# Patient Record
Sex: Female | Born: 1945 | Hispanic: No | Marital: Single | Smoking: Never smoker
Health system: Southern US, Community
[De-identification: ages and names within clinical notes are randomized; demographics above are authoritative.]

## PROBLEM LIST (undated history)

## (undated) DIAGNOSIS — I10 Essential (primary) hypertension: Secondary | ICD-10-CM

---

## 1972-09-03 HISTORY — PX: BREAST EXCISIONAL BIOPSY: SUR124

## 1978-09-03 HISTORY — PX: BREAST EXCISIONAL BIOPSY: SUR124

## 1999-06-30 ENCOUNTER — Other Ambulatory Visit: Admission: RE | Admit: 1999-06-30 | Discharge: 1999-06-30 | Payer: Self-pay | Admitting: *Deleted

## 2000-07-09 ENCOUNTER — Other Ambulatory Visit: Admission: RE | Admit: 2000-07-09 | Discharge: 2000-07-09 | Payer: Self-pay | Admitting: *Deleted

## 2003-06-10 ENCOUNTER — Other Ambulatory Visit: Admission: RE | Admit: 2003-06-10 | Discharge: 2003-06-10 | Payer: Self-pay | Admitting: *Deleted

## 2003-06-28 ENCOUNTER — Ambulatory Visit (HOSPITAL_COMMUNITY): Admission: RE | Admit: 2003-06-28 | Discharge: 2003-06-28 | Payer: Self-pay | Admitting: Internal Medicine

## 2013-04-17 ENCOUNTER — Other Ambulatory Visit (HOSPITAL_COMMUNITY): Payer: Self-pay | Admitting: Family Medicine

## 2013-04-17 DIAGNOSIS — Z09 Encounter for follow-up examination after completed treatment for conditions other than malignant neoplasm: Secondary | ICD-10-CM

## 2013-04-17 DIAGNOSIS — Z139 Encounter for screening, unspecified: Secondary | ICD-10-CM

## 2013-04-24 ENCOUNTER — Ambulatory Visit (HOSPITAL_COMMUNITY): Payer: Self-pay

## 2015-02-03 DIAGNOSIS — E663 Overweight: Secondary | ICD-10-CM

## 2015-02-03 DIAGNOSIS — I10 Essential (primary) hypertension: Secondary | ICD-10-CM | POA: Insufficient documentation

## 2015-02-03 DIAGNOSIS — Z6829 Body mass index (BMI) 29.0-29.9, adult: Secondary | ICD-10-CM

## 2015-02-04 ENCOUNTER — Ambulatory Visit (INDEPENDENT_AMBULATORY_CARE_PROVIDER_SITE_OTHER): Payer: BLUE CROSS/BLUE SHIELD | Admitting: Cardiology

## 2015-02-04 ENCOUNTER — Encounter: Payer: Self-pay | Admitting: Cardiology

## 2015-02-04 VITALS — BP 160/100 | HR 80 | Ht 67.0 in | Wt 192.0 lb

## 2015-02-04 DIAGNOSIS — R011 Cardiac murmur, unspecified: Secondary | ICD-10-CM | POA: Diagnosis not present

## 2015-02-04 DIAGNOSIS — I10 Essential (primary) hypertension: Secondary | ICD-10-CM

## 2015-02-04 MED ORDER — CHLORTHALIDONE 25 MG PO TABS: 25.0000 mg | ORAL_TABLET | Freq: Every day | ORAL | Status: DC

## 2015-02-04 NOTE — Progress Notes (Signed)
Clinical Summary Gwendolyn Kemp is a 69 y.o.female seen today as a new patient for the following medical problems.  1. HTN - reports history of 20+ years of HTN - previously seen by Dr Melvern Banker in 2002. She states she had an [place] of kidneys and heart at that time.  - checks at home using wrist monitor. Typically 130-140s/80s-90s. Her numbers at clinic visits are usually much more elevated.  - compliant with meds. Recently given some bystolic samples by pcp but hast not started yet  - no tobacco. No EtoH. Never been tested for sleep apnea (unsure if snores, unsure of apneic episodes, no daytime somnolence). Rarely takes NSAIDs. She reports prior renal US by Dr Melvern Banker, unclear if renal arteries or not - has cut back on salt in diet   PMH HTN     Current Outpatient Prescriptions  Medication Sig Dispense Refill  . aspirin 81 MG tablet Take 81 mg by mouth daily.    . clonazePAM (KLONOPIN) 0.5 MG tablet Take 0.5 mg by mouth 2 (two) times daily as needed for anxiety.    . fluticasone (FLONASE) 50 MCG/ACT nasal spray Place 1 spray into both nostrils daily.    . naproxen (NAPROSYN) 500 MG tablet Take 500 mg by mouth 2 (two) times daily with a meal.    . simvastatin (ZOCOR) 10 MG tablet Take 10 mg by mouth daily.     No current facility-administered medications for this visit.     No past surgical history on file.   NKDA    No family history on file.   Social History History   Social History  . Marital Status: Single    Spouse Name: N/A  . Number of Children: N/A  . Years of Education: N/A   Occupational History  . Not on file.   Social History Main Topics  . Smoking status: Never Smoker   . Smokeless tobacco: Not on file  . Alcohol Use: Not on file  . Drug Use: Not on file  . Sexual Activity: Not on file   Other Topics Concern  . Not on file   Social History Narrative  . No narrative on file     Review of Systems CONSTITUTIONAL: No weight loss, fever,  chills, weakness or fatigue.  HEENT: Eyes: No visual loss, blurred vision, double vision or yellow sclerae.No hearing loss, sneezing, congestion, runny nose or sore throat.  SKIN: No rash or itching.  CARDIOVASCULAR:  RESPIRATORY: No shortness of breath, cough or sputum.  GASTROINTESTINAL: No anorexia, nausea, vomiting or diarrhea. No abdominal pain or blood.  GENITOURINARY: No burning on urination, no polyuria NEUROLOGICAL: No headache, dizziness, syncope, paralysis, ataxia, numbness or tingling in the extremities. No change in bowel or bladder control.  MUSCULOSKELETAL: No muscle, back pain, joint pain or stiffness.  LYMPHATICS: No enlarged nodes. No history of splenectomy.  PSYCHIATRIC: No history of depression or anxiety.  ENDOCRINOLOGIC: No reports of sweating, cold or heat intolerance. No polyuria or polydipsia.  Marland Kitchen   Physical Examination Filed Vitals:   02/04/15 1043  BP: 160/100  Pulse: 80   Filed Vitals:   02/04/15 1043  Height: 5\' 7"  (1.702 m)  Weight: 192 lb (87.091 kg)    Gen: resting comfortably, no acute distress HEENT: no scleral icterus, pupils equal round and reactive, no palptable cervical adenopathy,  CV: RRR, 3/6 systolic murmur RUSB, no jVD Resp: Clear to auscultation bilaterally GI: abdomen is soft, non-tender, non-distended, normal bowel sounds, no hepatosplenomegaly MSK:  extremities are warm, no edema.  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 She will keep bp log x 2 weeks and also bring her bp cuff next visit - will stop HCTZ and start chlorthalidone for better bp control - request pcp labs, request Dr Gambles old records. Will see if needs additional evaluation for  possible secondary HTN - given info on DASH diet  2. Heart murmur - obtain echo      Gwendolyn Kemp, M.D.

## 2015-02-04 NOTE — Patient Instructions (Signed)
Your physician recommends that you schedule a follow-up appointment in:  1 month  Your physician has requested that you have an echocardiogram. Echocardiography is a painless test that uses sound waves to create images of your heart. It provides your doctor with information about the size and shape of your heart and how well your heart's chambers and valves are working. This procedure takes approximately one hour. There are no restrictions for this procedure.  STOP HCTZ   START CHLORTHALIDONE 25 MG  DAILY  KEEP BLOOD PRESSURE LOG FOR 2 WEEKS & DROP OFF DASH Eating Plan DASH stands for "Dietary Approaches to Stop Hypertension." The DASH eating plan is a healthy eating plan that has been shown to reduce high blood pressure (hypertension). Additional health benefits may include reducing the risk of type 2 diabetes mellitus, heart disease, and stroke. The DASH eating plan may also help with weight loss. WHAT DO I NEED TO KNOW ABOUT THE DASH EATING PLAN? For the DASH eating plan, you will follow these general guidelines:  Choose foods with a percent daily value for sodium of less than 5% (as listed on the food label).  Use salt-free seasonings or herbs instead of table salt or sea salt.  Check with your health care provider or pharmacist before using salt substitutes.  Eat lower-sodium products, often labeled as "lower sodium" or "no salt added."  Eat fresh foods.  Eat more vegetables, fruits, and low-fat dairy products.  Choose whole grains. Look for the word "whole" as the first word in the ingredient list.  Choose fish and skinless chicken or [place] more often than red meat. Limit fish, poultry, and meat to 6 oz (170 g) each day.  Limit sweets, desserts, sugars, and sugary drinks.  Choose heart-healthy fats.  Limit cheese to 1 oz (28 g) per day.  Eat more home-cooked food and less restaurant, buffet, and fast food.  Limit fried foods.  Cook foods using methods other than  frying.  Limit canned vegetables. If you do use them, rinse them well to decrease the sodium.  When eating at a restaurant, ask that your food be prepared with less salt, or no salt if possible. WHAT FOODS CAN I EAT? Seek help from a dietitian for individual calorie needs. Grains Whole grain or whole wheat bread. [place] rice. Whole grain or whole wheat pasta. Quinoa, bulgur, and whole grain cereals. Low-sodium cereals. Corn or whole wheat flour tortillas. Whole grain cornbread. Whole grain crackers. Low-sodium crackers. Vegetables Fresh or frozen vegetables (raw, steamed, roasted, or grilled). Low-sodium or reduced-sodium tomato and vegetable juices. Low-sodium or reduced-sodium tomato sauce and paste. Low-sodium or reduced-sodium canned vegetables.  Fruits All fresh, canned (in natural juice), or frozen fruits. Meat and Other Protein Products Ground beef (85% or leaner), grass-fed beef, or beef trimmed of fat. Skinless chicken or [place]. Ground chicken or [place]. Pork trimmed of fat. All fish and seafood. Eggs. Dried beans, peas, or lentils. Unsalted nuts and seeds. Unsalted canned beans. Dairy Low-fat dairy products, such as skim or 1% milk, 2% or reduced-fat cheeses, low-fat ricotta or cottage cheese, or plain low-fat yogurt. Low-sodium or reduced-sodium cheeses. Fats and Oils Tub margarines without trans fats. Light or reduced-fat mayonnaise and salad dressings (reduced sodium). Avocado. Safflower, olive, or canola oils. Natural peanut or almond butter. Other Unsalted popcorn and pretzels. The items listed above may not be a complete list of recommended foods or beverages. Contact your dietitian for more options. WHAT FOODS ARE NOT RECOMMENDED? Grains White bread. White  pasta. White rice. Refined cornbread. Bagels and croissants. Crackers that contain trans fat. Vegetables Creamed or fried vegetables. Vegetables in a cheese sauce. Regular canned vegetables. Regular canned tomato sauce  and paste. Regular tomato and vegetable juices. Fruits Dried fruits. Canned fruit in light or heavy syrup. Fruit juice. Meat and Other Protein Products Fatty cuts of meat. Ribs, chicken wings, bacon, sausage, bologna, salami, chitterlings, fatback, hot dogs, bratwurst, and packaged luncheon meats. Salted nuts and seeds. Canned beans with salt. Dairy Whole or 2% milk, cream, half-and-half, and cream cheese. Whole-fat or sweetened yogurt. Full-fat cheeses or blue cheese. Nondairy creamers and whipped toppings. Processed cheese, cheese spreads, or cheese curds. Condiments Onion and garlic salt, seasoned salt, table salt, and sea salt. Canned and packaged gravies. Worcestershire sauce. Tartar sauce. Barbecue sauce. Teriyaki sauce. Soy sauce, including reduced sodium. Steak sauce. Fish sauce. Oyster sauce. Cocktail sauce. Horseradish. Ketchup and mustard. Meat flavorings and tenderizers. Bouillon cubes. Hot sauce. Tabasco sauce. Marinades. Taco seasonings. Relishes. Fats and Oils Butter, stick margarine, lard, shortening, ghee, and bacon fat. Coconut, palm kernel, or palm oils. Regular salad dressings. Other Pickles and olives. Salted popcorn and pretzels. The items listed above may not be a complete list of foods and beverages to avoid. Contact your dietitian for more information. WHERE CAN I FIND MORE INFORMATION? National Heart, Lung, and Blood Institute: travelstabloid.com Document Released: 08/09/2011 Document Revised: 01/04/2014 Document Reviewed: 06/24/2013 [facility] Patient Information 2015 [place], [place]. This information is not intended to replace advice given to you by your health care provider. Make sure you discuss any questions you have with your health care provider.  Thanks for choosing [facility]!!!

## 2015-02-06 ENCOUNTER — Encounter: Payer: Self-pay | Admitting: Cardiology

## 2015-02-25 ENCOUNTER — Other Ambulatory Visit (HOSPITAL_COMMUNITY): Payer: BLUE CROSS/BLUE SHIELD

## 2015-03-04 ENCOUNTER — Ambulatory Visit: Payer: BLUE CROSS/BLUE SHIELD | Admitting: Cardiology

## 2015-11-03 DIAGNOSIS — H6121 Impacted cerumen, right ear: Secondary | ICD-10-CM | POA: Diagnosis not present

## 2015-11-03 DIAGNOSIS — E782 Mixed hyperlipidemia: Secondary | ICD-10-CM | POA: Diagnosis not present

## 2015-11-03 DIAGNOSIS — Z1389 Encounter for screening for other disorder: Secondary | ICD-10-CM | POA: Diagnosis not present

## 2015-11-03 DIAGNOSIS — Z6831 Body mass index (BMI) 31.0-31.9, adult: Secondary | ICD-10-CM | POA: Diagnosis not present

## 2015-11-03 DIAGNOSIS — E6609 Other obesity due to excess calories: Secondary | ICD-10-CM | POA: Diagnosis not present

## 2015-11-03 DIAGNOSIS — I10 Essential (primary) hypertension: Secondary | ICD-10-CM | POA: Diagnosis not present

## 2016-02-09 DIAGNOSIS — Z6831 Body mass index (BMI) 31.0-31.9, adult: Secondary | ICD-10-CM | POA: Diagnosis not present

## 2016-02-09 DIAGNOSIS — Z1389 Encounter for screening for other disorder: Secondary | ICD-10-CM | POA: Diagnosis not present

## 2016-02-09 DIAGNOSIS — I10 Essential (primary) hypertension: Secondary | ICD-10-CM | POA: Diagnosis not present

## 2016-02-09 DIAGNOSIS — E782 Mixed hyperlipidemia: Secondary | ICD-10-CM | POA: Diagnosis not present

## 2016-02-09 DIAGNOSIS — R0989 Other specified symptoms and signs involving the circulatory and respiratory systems: Secondary | ICD-10-CM | POA: Diagnosis not present

## 2016-02-28 ENCOUNTER — Other Ambulatory Visit (HOSPITAL_COMMUNITY): Payer: Self-pay | Admitting: Family Medicine

## 2016-02-28 DIAGNOSIS — Z1231 Encounter for screening mammogram for malignant neoplasm of breast: Secondary | ICD-10-CM

## 2016-05-04 ENCOUNTER — Other Ambulatory Visit: Payer: Self-pay | Admitting: Family Medicine

## 2016-05-04 DIAGNOSIS — Z1231 Encounter for screening mammogram for malignant neoplasm of breast: Secondary | ICD-10-CM

## 2016-05-28 ENCOUNTER — Ambulatory Visit
Admission: RE | Admit: 2016-05-28 | Discharge: 2016-05-28 | Disposition: A | Discharge status: Home / Self Care | Payer: PPO | Source: Ambulatory Visit | Attending: Family Medicine | Admitting: Family Medicine

## 2016-05-28 DIAGNOSIS — Z1231 Encounter for screening mammogram for malignant neoplasm of breast: Secondary | ICD-10-CM | POA: Diagnosis not present

## 2016-08-13 DIAGNOSIS — H524 Presbyopia: Secondary | ICD-10-CM | POA: Diagnosis not present

## 2017-01-03 DIAGNOSIS — I10 Essential (primary) hypertension: Secondary | ICD-10-CM | POA: Diagnosis not present

## 2017-01-03 DIAGNOSIS — Z1389 Encounter for screening for other disorder: Secondary | ICD-10-CM | POA: Diagnosis not present

## 2017-01-03 DIAGNOSIS — E782 Mixed hyperlipidemia: Secondary | ICD-10-CM | POA: Diagnosis not present

## 2017-01-03 DIAGNOSIS — Z6830 Body mass index (BMI) 30.0-30.9, adult: Secondary | ICD-10-CM | POA: Diagnosis not present

## 2017-01-03 DIAGNOSIS — Z1211 Encounter for screening for malignant neoplasm of colon: Secondary | ICD-10-CM | POA: Diagnosis not present

## 2017-01-22 DIAGNOSIS — Z1211 Encounter for screening for malignant neoplasm of colon: Secondary | ICD-10-CM | POA: Diagnosis not present

## 2017-02-13 DIAGNOSIS — I10 Essential (primary) hypertension: Secondary | ICD-10-CM | POA: Diagnosis not present

## 2017-02-13 DIAGNOSIS — E782 Mixed hyperlipidemia: Secondary | ICD-10-CM | POA: Diagnosis not present

## 2017-02-13 DIAGNOSIS — Z1389 Encounter for screening for other disorder: Secondary | ICD-10-CM | POA: Diagnosis not present

## 2017-02-13 DIAGNOSIS — Z634 Disappearance and death of family member: Secondary | ICD-10-CM | POA: Diagnosis not present

## 2017-06-17 ENCOUNTER — Other Ambulatory Visit: Payer: Self-pay | Admitting: Family Medicine

## 2017-06-17 DIAGNOSIS — Z1231 Encounter for screening mammogram for malignant neoplasm of breast: Secondary | ICD-10-CM

## 2017-07-03 ENCOUNTER — Ambulatory Visit
Admission: RE | Admit: 2017-07-03 | Discharge: 2017-07-03 | Disposition: A | Discharge status: Home / Self Care | Payer: PPO | Source: Ambulatory Visit | Attending: Family Medicine | Admitting: Family Medicine

## 2017-07-03 DIAGNOSIS — Z1231 Encounter for screening mammogram for malignant neoplasm of breast: Secondary | ICD-10-CM | POA: Diagnosis not present

## 2017-07-05 ENCOUNTER — Other Ambulatory Visit: Payer: Self-pay | Admitting: Family Medicine

## 2017-07-05 DIAGNOSIS — R928 Other abnormal and inconclusive findings on diagnostic imaging of breast: Secondary | ICD-10-CM

## 2017-07-10 ENCOUNTER — Ambulatory Visit
Admission: RE | Admit: 2017-07-10 | Discharge: 2017-07-10 | Disposition: A | Discharge status: Home / Self Care | Payer: PPO | Source: Ambulatory Visit | Attending: Family Medicine | Admitting: Family Medicine

## 2017-07-10 DIAGNOSIS — N651 Disproportion of reconstructed breast: Secondary | ICD-10-CM | POA: Diagnosis not present

## 2017-07-10 DIAGNOSIS — R928 Other abnormal and inconclusive findings on diagnostic imaging of breast: Secondary | ICD-10-CM

## 2017-07-10 DIAGNOSIS — R922 Inconclusive mammogram: Secondary | ICD-10-CM | POA: Diagnosis not present

## 2017-07-11 ENCOUNTER — Other Ambulatory Visit: Payer: PPO

## 2017-12-26 DIAGNOSIS — Z1389 Encounter for screening for other disorder: Secondary | ICD-10-CM | POA: Diagnosis not present

## 2017-12-26 DIAGNOSIS — Z6831 Body mass index (BMI) 31.0-31.9, adult: Secondary | ICD-10-CM | POA: Diagnosis not present

## 2017-12-26 DIAGNOSIS — I10 Essential (primary) hypertension: Secondary | ICD-10-CM | POA: Diagnosis not present

## 2017-12-26 DIAGNOSIS — E6609 Other obesity due to excess calories: Secondary | ICD-10-CM | POA: Diagnosis not present

## 2018-01-02 DIAGNOSIS — E6609 Other obesity due to excess calories: Secondary | ICD-10-CM | POA: Diagnosis not present

## 2018-01-02 DIAGNOSIS — Z6831 Body mass index (BMI) 31.0-31.9, adult: Secondary | ICD-10-CM | POA: Diagnosis not present

## 2018-01-02 DIAGNOSIS — Z1389 Encounter for screening for other disorder: Secondary | ICD-10-CM | POA: Diagnosis not present

## 2018-01-02 DIAGNOSIS — E7849 Other hyperlipidemia: Secondary | ICD-10-CM | POA: Diagnosis not present

## 2018-05-15 DIAGNOSIS — E6609 Other obesity due to excess calories: Secondary | ICD-10-CM | POA: Diagnosis not present

## 2018-05-15 DIAGNOSIS — I10 Essential (primary) hypertension: Secondary | ICD-10-CM | POA: Diagnosis not present

## 2018-05-15 DIAGNOSIS — Z23 Encounter for immunization: Secondary | ICD-10-CM | POA: Diagnosis not present

## 2018-05-15 DIAGNOSIS — Z6830 Body mass index (BMI) 30.0-30.9, adult: Secondary | ICD-10-CM | POA: Diagnosis not present

## 2018-05-15 DIAGNOSIS — Z1389 Encounter for screening for other disorder: Secondary | ICD-10-CM | POA: Diagnosis not present

## 2018-07-21 DIAGNOSIS — Z1211 Encounter for screening for malignant neoplasm of colon: Secondary | ICD-10-CM | POA: Diagnosis not present

## 2018-08-07 DIAGNOSIS — I10 Essential (primary) hypertension: Secondary | ICD-10-CM | POA: Diagnosis not present

## 2018-08-07 DIAGNOSIS — Z6830 Body mass index (BMI) 30.0-30.9, adult: Secondary | ICD-10-CM | POA: Diagnosis not present

## 2018-08-07 DIAGNOSIS — Z1389 Encounter for screening for other disorder: Secondary | ICD-10-CM | POA: Diagnosis not present

## 2018-08-07 DIAGNOSIS — Z0001 Encounter for general adult medical examination with abnormal findings: Secondary | ICD-10-CM | POA: Diagnosis not present

## 2018-08-07 DIAGNOSIS — E7849 Other hyperlipidemia: Secondary | ICD-10-CM | POA: Diagnosis not present

## 2018-08-07 DIAGNOSIS — E6609 Other obesity due to excess calories: Secondary | ICD-10-CM | POA: Diagnosis not present

## 2018-08-20 DIAGNOSIS — Z6830 Body mass index (BMI) 30.0-30.9, adult: Secondary | ICD-10-CM | POA: Diagnosis not present

## 2018-08-20 DIAGNOSIS — E6609 Other obesity due to excess calories: Secondary | ICD-10-CM | POA: Diagnosis not present

## 2018-08-20 DIAGNOSIS — E7849 Other hyperlipidemia: Secondary | ICD-10-CM | POA: Diagnosis not present

## 2018-08-20 DIAGNOSIS — Z1389 Encounter for screening for other disorder: Secondary | ICD-10-CM | POA: Diagnosis not present

## 2018-09-15 ENCOUNTER — Ambulatory Visit: Payer: PPO

## 2018-09-30 ENCOUNTER — Ambulatory Visit (INDEPENDENT_AMBULATORY_CARE_PROVIDER_SITE_OTHER): Payer: PPO

## 2018-09-30 DIAGNOSIS — Z1211 Encounter for screening for malignant neoplasm of colon: Secondary | ICD-10-CM

## 2018-09-30 MED ORDER — NA SULFATE-K SULFATE-MG SULF 17.5-3.13-1.6 GM/177ML PO SOLN: 1.0000 | ORAL | 0 refills | Status: AC

## 2018-09-30 NOTE — Patient Instructions (Addendum)
Gwendolyn Kemp   Aug 16, 1946 MRN: [id number]    Procedure Date: 05/19/2019 Time to register: 7:30am Place to register: [facility] Procedure Time: 8:30am Scheduled provider: R. Garfield Cornea, MD  PREPARATION FOR COLONOSCOPY WITH TRI-LYTE SPLIT PREP  Please notify us immediately if you are diabetic, take iron supplements, or if you are on Coumadin or any other blood thinners.   You will need to purchase 1 fleet enema and 1 box of Bisacodyl 10m tablets. You can find these over the counter at your pharmacy.    2 DAYS BEFORE PROCEDURE:  DATE: 05/17/2019  DAY: Sunday Begin clear liquid diet AFTER your lunch meal. NO SOLID FOODS after this point.  1 DAY BEFORE PROCEDURE:  DATE: 05/18/2019   DAY: Monday Continue clear liquids the entire day - NO SOLID FOOD.   At 2:00 pm:  Take 2 Bisacodyl tablets.   At 4:00pm:  Start drinking your solution. Make sure you mix well per instructions on the bottle. Try to drink 1 (one) 8 ounce glass every 10-15 minutes until you have consumed HALF the jug. You should complete by 6:00pm.You must keep the left over solution refrigerated until completed next day.  Continue clear liquids. You must drink plenty of clear liquids to prevent dehyration and kidney failure.     DAY OF PROCEDURE:   DATE: 05/19/2019   DAY: Tuesday If you take medications for your heart, blood pressure or breathing, you may take these medications.   Five hours before your procedure time @ 3:30am:  Finish remaining amout of bowel prep, drinking 1 (one) 8 ounce glass every 10-15 minutes until complete. You have two hours to consume remaining prep.   Three hours before your procedure time '@5' :30am:  Nothing by mouth.   At least one hour before going to the hospital:  Give yourself one Fleet enema.  You may take your morning medications with sip of water unless we have instructed otherwise.      Please see below for Dietary Information.  CLEAR LIQUIDS INCLUDE:  Water Jello  (NOT red in color)   Ice Popsicles (NOT red in color)   Tea (sugar ok, no milk/cream) Powdered fruit flavored drinks  Coffee (sugar ok, no milk/cream) Gatorade/ Lemonade/ Kool-Aid  (NOT red in color)   Juice: apple, white grape, white cranberry Soft drinks  Clear bullion, consomme, broth (fat free beef/chicken/vegetable)  Carbonated beverages (any kind)  Strained chicken noodle soup Hard Candy   Remember: Clear liquids are liquids that will allow you to see your fingers on the other side of a clear glass. Be sure liquids are NOT red in color, and not cloudy, but CLEAR.  DO NOT EAT OR DRINK ANY OF THE FOLLOWING:  Dairy products of any kind   Cranberry juice Tomato juice / V8 juice   Grapefruit juice Orange juice     Red grape juice  Do not eat any solid foods, including such foods as: cereal, oatmeal, yogurt, fruits, vegetables, creamed soups, eggs, bread, crackers, pureed foods in a blender, etc.   HELPFUL HINTS FOR DRINKING PREP SOLUTION:   Make sure prep is extremely cold. Mix and refrigerate the the morning of the prep. You may also put in the freezer.   You may try mixing some Crystal Light or Country Time Lemonade if you prefer. Mix in small amounts; add more if necessary.  Try drinking through a straw  Rinse mouth with water or a mouthwash between glasses, to remove after-taste.  Try sipping on  a cold beverage /ice/ popsicles between glasses of prep.  Place a piece of sugar-free hard candy in mouth between glasses.  If you become nauseated, try consuming smaller amounts, or stretch out the time between glasses. Stop for 30-60 minutes, then slowly start back drinking.        OTHER INSTRUCTIONS  You will need a responsible adult at least 71 years of age to accompany you and drive you home. This person must remain in the waiting room during your procedure. The hospital will cancel your procedure if you do not have a responsible adult with you.   1. Wear loose fitting  clothing that is easily removed. 2. Leave jewelry and other valuables at home.  3. Remove all body piercing jewelry and leave at home. 4. Total time from sign-in until discharge is approximately 2-3 hours. 5. You should go home directly after your procedure and rest. You can resume normal activities the day after your procedure. 6. The day of your procedure you should not:  Drive  Make legal decisions  Operate machinery  Drink alcohol  Return to work   You may call the office (Dept: [phone]) before 5:00pm, or page the doctor on call [phone]) after 5:00pm, for further instructions, if necessary.   Insurance Information YOU WILL NEED TO CHECK WITH YOUR INSURANCE COMPANY FOR THE BENEFITS OF COVERAGE YOU HAVE FOR THIS PROCEDURE.  UNFORTUNATELY, NOT ALL INSURANCE COMPANIES HAVE BENEFITS TO COVER ALL OR PART OF THESE TYPES OF PROCEDURES.  IT IS YOUR RESPONSIBILITY TO CHECK YOUR BENEFITS, HOWEVER, WE WILL BE GLAD TO ASSIST YOU WITH ANY CODES YOUR INSURANCE COMPANY MAY NEED.    PLEASE NOTE THAT MOST INSURANCE COMPANIES WILL NOT COVER A SCREENING COLONOSCOPY FOR PEOPLE UNDER THE AGE OF 50  IF YOU HAVE BCBS INSURANCE, YOU MAY HAVE BENEFITS FOR A SCREENING COLONOSCOPY BUT IF POLYPS ARE FOUND THE DIAGNOSIS WILL CHANGE AND THEN YOU MAY HAVE A DEDUCTIBLE THAT WILL NEED TO BE MET. SO PLEASE MAKE SURE YOU CHECK YOUR BENEFITS FOR A SCREENING COLONOSCOPY AS WELL AS A DIAGNOSTIC COLONOSCOPY.

## 2018-09-30 NOTE — Progress Notes (Signed)
Gastroenterology Pre-Procedure Review  Request Date:09/30/18 Requesting Physician: Delman Cheadle [place]- last tcs 2004- [facility]- no report in epic  PATIENT REVIEW QUESTIONS: The patient responded to the following health history questions as indicated:    1. Diabetes Melitis: no 2. Joint replacements in the past 12 months: no 3. Major health problems in the past 3 months: no 4. Has an artificial valve or MVP: no 5. Has a defibrillator: no 6. Has been advised in past to take antibiotics in advance of a procedure like teeth cleaning: no 7. Family history of colon cancer: no  8. Alcohol Use: no 9. History of sleep apnea: no  10. History of coronary artery or other vascular stents placed within the last 12 months: no 11. History of any prior anesthesia complications: no    MEDICATIONS & ALLERGIES:    Patient reports the following regarding taking any blood thinners:   Plavix? no Aspirin? no Coumadin? no Brilinta? no Xarelto? no Eliquis? no Pradaxa? no Savaysa? no Effient? no  Patient confirms/reports the following medications:  Current Outpatient Medications  Medication Sig Dispense Refill  . amLODipine (NORVASC) 10 MG tablet Take 10 mg by mouth daily.   0  . chlorthalidone (HYGROTON) 25 MG tablet Take 1 tablet (25 mg total) by mouth daily. 90 tablet 3  . POTASSIUM CHLORIDE PO Take by mouth.    . quinapril (ACCUPRIL) 40 MG tablet Take 40 mg by mouth daily.   8   No current facility-administered medications for this visit.     Patient confirms/reports the following allergies:  No Known Allergies  No orders of the defined types were placed in this encounter.   AUTHORIZATION INFORMATION Primary Insurance: [employer],  ID #: [id number] Pre-Cert / Josem Kaufmann required: no   SCHEDULE INFORMATION: Procedure has been scheduled as follows:  Date: 12/16/18, Time: 9:45 Location: [facility] Dr.Rourk  This Gastroenterology Pre-Precedure Review Form is being routed to the  following provider(s): Neil Crouch, PA

## 2018-10-13 ENCOUNTER — Telehealth: Payer: Self-pay | Admitting: Internal Medicine

## 2018-10-13 MED ORDER — PEG 3350-KCL-NA BICARB-NACL 420 G PO SOLR: 4000.0000 mL | ORAL | 0 refills | Status: DC

## 2018-10-13 NOTE — Progress Notes (Signed)
Ok to schedule.

## 2018-10-13 NOTE — Addendum Note (Signed)
Addended by: Claudina Lick on: 10/13/2018 12:38 PM   Modules accepted: Orders

## 2018-10-13 NOTE — Telephone Encounter (Signed)
Checked the pt instructions and spoke with the pt, the dates are wrong and I apologized to the pt for this. She also said the prep will be $90 and she would like something cheaper. I have sent in trilyte and mailed new instructions with the correct dates and times.

## 2018-10-13 NOTE — Telephone Encounter (Signed)
Please call patient at [phone] she has questions about the dates on her instruction sheet.

## 2018-10-13 NOTE — Progress Notes (Signed)
Pt called- prep is $90 and she is asking for something cheaper. trilyte sent in and new instructions with correct date and time mailed to the pt.

## 2018-11-24 ENCOUNTER — Telehealth: Payer: Self-pay | Admitting: Internal Medicine

## 2018-11-24 NOTE — Telephone Encounter (Signed)
Patient called and left message asking if her procedure needed to be rescheduled due to the virus

## 2018-11-24 NOTE — Telephone Encounter (Signed)
Pt called to see if she could still have her procedure in April.  Pt informed that we will call her at least a week in advance to let her know if procedures are permitted at that time.  If not, she is aware that we may have to re-schedule her.  Pt voiced understanding.

## 2018-12-02 ENCOUNTER — Telehealth: Payer: Self-pay | Admitting: Internal Medicine

## 2018-12-02 NOTE — Telephone Encounter (Signed)
Pt wants to cancel her procedure with [facility] due to covid 19 and said she would call back around May to reschedule.

## 2018-12-02 NOTE — Telephone Encounter (Signed)
Called endo, LM to cancel procedure. I have taken her off the schedule.

## 2018-12-16 ENCOUNTER — Encounter (HOSPITAL_COMMUNITY): Admission: RE | Payer: Self-pay | Source: Home / Self Care

## 2018-12-16 ENCOUNTER — Ambulatory Visit (HOSPITAL_COMMUNITY): Admission: RE | Admit: 2018-12-16 | Payer: PPO | Source: Home / Self Care | Admitting: Internal Medicine

## 2018-12-16 SURGERY — COLONOSCOPY
Anesthesia: Moderate Sedation

## 2019-02-13 ENCOUNTER — Other Ambulatory Visit (HOSPITAL_COMMUNITY): Payer: Self-pay | Admitting: Family Medicine

## 2019-02-13 DIAGNOSIS — I10 Essential (primary) hypertension: Secondary | ICD-10-CM | POA: Diagnosis not present

## 2019-02-13 DIAGNOSIS — Z0001 Encounter for general adult medical examination with abnormal findings: Secondary | ICD-10-CM | POA: Diagnosis not present

## 2019-02-13 DIAGNOSIS — Z1389 Encounter for screening for other disorder: Secondary | ICD-10-CM | POA: Diagnosis not present

## 2019-02-13 DIAGNOSIS — E6609 Other obesity due to excess calories: Secondary | ICD-10-CM | POA: Diagnosis not present

## 2019-02-13 DIAGNOSIS — D259 Leiomyoma of uterus, unspecified: Secondary | ICD-10-CM

## 2019-02-13 DIAGNOSIS — Z6830 Body mass index (BMI) 30.0-30.9, adult: Secondary | ICD-10-CM | POA: Diagnosis not present

## 2019-02-24 ENCOUNTER — Telehealth: Payer: Self-pay | Admitting: Internal Medicine

## 2019-02-24 NOTE — Telephone Encounter (Signed)
Angie, can you call the patient and see if any of her triage information or medications has changed. If not, can you reschedule her. Thank you.

## 2019-02-24 NOTE — Telephone Encounter (Signed)
Patient called to reschedule, does she need another nurse visit or an office visit?

## 2019-02-25 NOTE — Telephone Encounter (Signed)
Gwendolyn Kemp, she is taking another fluid pill now.  Other than that, all is well with her.  Would you like me to re-schedule the procedure or nurse visit?

## 2019-02-26 NOTE — Telephone Encounter (Signed)
You can just reschedule her procedure. Please update her meds and send it to whichever provider signed off on her triage.

## 2019-02-26 NOTE — Progress Notes (Signed)
Pt re-scheduled her procedure to 05/19/2019.  Pt had a change in medication (fluid pill) since her last nurse visit.  Updated her medication list.  Pt aware that we will mail out new prep instructions.

## 2019-02-26 NOTE — Telephone Encounter (Signed)
Medications updated and pt requested to re-schedule her procedure to 05/19/2019.  Routing updated info to [facility].

## 2019-02-26 NOTE — Addendum Note (Signed)
Addended by: Metro Kung on: 02/26/2019 11:47 AM   Modules accepted: Orders, SmartSet

## 2019-03-04 ENCOUNTER — Telehealth: Payer: Self-pay | Admitting: *Deleted

## 2019-03-04 NOTE — Telephone Encounter (Signed)
Pt is scheduled for her COVID 19 screening on 05/15/2019.  Pt aware to remain in quarantine once testing is done.  Pt voiced understanding.

## 2019-03-04 NOTE — Telephone Encounter (Signed)
Called pt to schedule COVID 19 screening but had to [facility] for pt to call us back.

## 2019-03-05 ENCOUNTER — Ambulatory Visit: Payer: PPO

## 2019-03-08 NOTE — Progress Notes (Signed)
No changes needed 

## 2019-03-16 ENCOUNTER — Telehealth: Payer: Self-pay | Admitting: Internal Medicine

## 2019-03-16 NOTE — Telephone Encounter (Signed)
[phone] please call patient, she has a question about her procedure instructions

## 2019-03-16 NOTE — Telephone Encounter (Signed)
Called pt back and all questions were answered.  

## 2019-03-16 NOTE — Telephone Encounter (Signed)
Angie, will you please call the pt.

## 2019-03-23 ENCOUNTER — Other Ambulatory Visit: Payer: PPO

## 2019-03-23 DIAGNOSIS — Z20822 Contact with and (suspected) exposure to covid-19: Secondary | ICD-10-CM

## 2019-03-23 DIAGNOSIS — R6889 Other general symptoms and signs: Secondary | ICD-10-CM | POA: Diagnosis not present

## 2019-03-26 LAB — NOVEL CORONAVIRUS, NAA: SARS-CoV-2, NAA: NOT DETECTED

## 2019-05-15 ENCOUNTER — Other Ambulatory Visit: Payer: Self-pay

## 2019-05-15 ENCOUNTER — Other Ambulatory Visit (HOSPITAL_COMMUNITY)
Admission: RE | Admit: 2019-05-15 | Discharge: 2019-05-15 | Disposition: A | Discharge status: Home / Self Care | Payer: PPO | Source: Ambulatory Visit | Attending: Internal Medicine | Admitting: Internal Medicine

## 2019-05-15 DIAGNOSIS — Z01812 Encounter for preprocedural laboratory examination: Secondary | ICD-10-CM | POA: Insufficient documentation

## 2019-05-15 DIAGNOSIS — Z20828 Contact with and (suspected) exposure to other viral communicable diseases: Secondary | ICD-10-CM | POA: Diagnosis not present

## 2019-05-15 LAB — SARS CORONAVIRUS 2 (TAT 6-24 HRS): SARS Coronavirus 2: NEGATIVE

## 2019-05-19 ENCOUNTER — Ambulatory Visit (HOSPITAL_COMMUNITY)
Admission: RE | Admit: 2019-05-19 | Discharge: 2019-05-19 | Disposition: A | Discharge status: Home / Self Care | Payer: PPO | Attending: Internal Medicine | Admitting: Internal Medicine

## 2019-05-19 ENCOUNTER — Encounter (HOSPITAL_COMMUNITY): Payer: Self-pay

## 2019-05-19 ENCOUNTER — Encounter (HOSPITAL_COMMUNITY)
Admission: RE | Disposition: A | Discharge status: Home / Self Care | Payer: Self-pay | Source: Home / Self Care | Attending: Internal Medicine

## 2019-05-19 DIAGNOSIS — Z8249 Family history of ischemic heart disease and other diseases of the circulatory system: Secondary | ICD-10-CM | POA: Insufficient documentation

## 2019-05-19 DIAGNOSIS — Z79899 Other long term (current) drug therapy: Secondary | ICD-10-CM | POA: Insufficient documentation

## 2019-05-19 DIAGNOSIS — Z825 Family history of asthma and other chronic lower respiratory diseases: Secondary | ICD-10-CM | POA: Diagnosis not present

## 2019-05-19 DIAGNOSIS — I10 Essential (primary) hypertension: Secondary | ICD-10-CM | POA: Insufficient documentation

## 2019-05-19 DIAGNOSIS — K635 Polyp of colon: Secondary | ICD-10-CM | POA: Diagnosis not present

## 2019-05-19 DIAGNOSIS — K5730 Diverticulosis of large intestine without perforation or abscess without bleeding: Secondary | ICD-10-CM | POA: Insufficient documentation

## 2019-05-19 DIAGNOSIS — D125 Benign neoplasm of sigmoid colon: Secondary | ICD-10-CM | POA: Diagnosis not present

## 2019-05-19 DIAGNOSIS — Z8042 Family history of malignant neoplasm of prostate: Secondary | ICD-10-CM | POA: Insufficient documentation

## 2019-05-19 DIAGNOSIS — Z1211 Encounter for screening for malignant neoplasm of colon: Secondary | ICD-10-CM | POA: Diagnosis not present

## 2019-05-19 DIAGNOSIS — D123 Benign neoplasm of transverse colon: Secondary | ICD-10-CM | POA: Insufficient documentation

## 2019-05-19 HISTORY — PX: COLONOSCOPY: SHX5424

## 2019-05-19 HISTORY — DX: Essential (primary) hypertension: I10

## 2019-05-19 HISTORY — PX: POLYPECTOMY: SHX5525

## 2019-05-19 SURGERY — COLONOSCOPY
Anesthesia: Moderate Sedation

## 2019-05-19 MED ORDER — MEPERIDINE HCL 100 MG/ML IJ SOLN
INTRAMUSCULAR | Status: DC | PRN
  Administered 2019-05-19: 25 mg
  Administered 2019-05-19: 15 mg

## 2019-05-19 MED ORDER — ONDANSETRON HCL 4 MG/2ML IJ SOLN
INTRAMUSCULAR | Status: DC | PRN
  Administered 2019-05-19: 4 mg via INTRAVENOUS

## 2019-05-19 MED ORDER — MEPERIDINE HCL 50 MG/ML IJ SOLN
INTRAMUSCULAR | Status: AC
  Filled 2019-05-19: qty 1

## 2019-05-19 MED ORDER — MIDAZOLAM HCL 5 MG/5ML IJ SOLN
INTRAMUSCULAR | Status: DC | PRN
  Administered 2019-05-19: 1 mg via INTRAVENOUS
  Administered 2019-05-19: 2 mg via INTRAVENOUS
  Administered 2019-05-19: 1 mg via INTRAVENOUS

## 2019-05-19 MED ORDER — SODIUM CHLORIDE 0.9 % IV SOLN
INTRAVENOUS | Status: DC
  Administered 2019-05-19: 08:00:00 via INTRAVENOUS

## 2019-05-19 MED ORDER — ONDANSETRON HCL 4 MG/2ML IJ SOLN
INTRAMUSCULAR | Status: AC
  Filled 2019-05-19: qty 2

## 2019-05-19 MED ORDER — MIDAZOLAM HCL 5 MG/5ML IJ SOLN
INTRAMUSCULAR | Status: AC
  Filled 2019-05-19: qty 10

## 2019-05-19 NOTE — H&P (Signed)
_0 @   Primary Care Physician:  Sharilyn Sites, MD Primary Gastroenterologist:  Dr.   Pre-Procedure History & Physical: HPI:  Gwendolyn Kemp is a 73 y.o. female is here for a screening colonoscopy.  Negative exam 2004.  No bowel symptoms.  No family history of colon cancer.  Here for average risk rating.  Past Medical History:  Diagnosis Date  . Hypertension       Prior to Admission medications   Medication Sig Start Date End Date Taking? Authorizing Provider  amLODIPine-Valsartan-HCTZ 10-320-25 MG TABS Take 1 tablet by mouth daily.   Yes [provider]  atorvastatin (LIPITOR) 10 MG tablet Take 10 mg by mouth at bedtime.   Yes [provider]  Na Sulfate-K Sulfate-Mg Sulf (SUPREP BOWEL PREP KIT) 17.5-3.13-1.6 GM/177ML SOLN Take 1 kit by mouth as directed. 09/30/18  Yes Mahala Menghini, PA-C  potassium chloride (K-DUR) 10 MEQ tablet Take 10 mEq by mouth daily.   Yes [provider]    Allergies as of 02/26/2019  . (No Known Allergies)    Family History  Problem Relation Age of Onset  . Hypertension Mother   . Hypertension Father   . Hypertension Brother   . Prostate cancer Brother   . Congestive Heart Failure Brother   . Asthma Brother     Social History   Socioeconomic History  . Marital status: Single    Spouse name: Not on file  . Number of children: Not on file  . Years of education: Not on file  . Highest education level: Not on file  Occupational History  . Not on file  Social Needs  . Financial resource strain: Not hard at all  . Food insecurity    Worry: Never true    Inability: Never true  . Transportation needs    Medical: No    Non-medical: No  Tobacco Use  . Smoking status: Never Smoker  . Smokeless tobacco: Never Used  Substance and Sexual Activity  . Alcohol use: Never    Alcohol/week: 0.0 standard drinks    Frequency: Never  . Drug use: Never  . Sexual activity: Not on file  Lifestyle  . Physical activity    Days per week: 2 days    Minutes per session: 30 min  . Stress: Not at all  Relationships  . Social Herbalist on phone: Patient refused    Gets together: Patient refused    Attends religious service: Patient refused    Active member of club or organization: Patient refused    Attends meetings of clubs or organizations: Patient refused    Relationship status: Patient refused  . Intimate partner violence    Fear of current or ex partner: Patient refused    Emotionally abused: Patient refused    Physically abused: Patient refused    Forced sexual activity: Patient refused  Other Topics Concern  . Not on file  Social History Narrative  . Not on file    Review of Systems: See HPI, otherwise negative ROS  Physical Exam: BP (!) 160/94   Pulse 88   Temp 99 F (37.2 C) (Oral)   Resp 17   Ht 5' 7" (1.702 m)   Wt 87.1 kg   SpO2 100%   BMI 30.07 kg/m  General:   Alert,  Well-developed, well-nourished, pleasant and cooperative in NAD Lungs:  Clear throughout to auscultation.   No wheezes, crackles, or rhonchi. No acute distress. Heart:  Regular rate and  rhythm; no murmurs, clicks, rubs,  or gallops. Abdomen:  Soft, nontender and nondistended. No masses, hepatosplenomegaly or hernias noted. Normal bowel sounds, without guarding, and without rebound.    Impression/Plan: Gwendolyn Kemp is now here to undergo a screening colonoscopy.  Average risk rating examination.  Risks, benefits, limitations, imponderables and alternatives regarding colonoscopy have been reviewed with the patient. Questions have been answered. All parties agreeable.     Notice:  This dictation was prepared with Dragon dictation along with smaller phrase technology. Any transcriptional errors that result from this process are unintentional and may not be corrected upon review.  

## 2019-05-19 NOTE — Discharge Instructions (Signed)
Colonoscopy Discharge Instructions  Read the instructions outlined below and refer to this sheet in the next few weeks. These discharge instructions provide you with general information on caring for yourself after you leave the hospital. Your doctor may also give you specific instructions. While your treatment has been planned according to the most current medical practices available, unavoidable complications occasionally occur. If you have any problems or questions after discharge, call Dr. Gala Romney at [phone]. ACTIVITY  You may resume your regular activity, but move at a slower pace for the next 24 hours.   Take frequent rest periods for the next 24 hours.   Walking will help get rid of the air and reduce the bloated feeling in your belly (abdomen).   No driving for 24 hours (because of the medicine (anesthesia) used during the test).    Do not sign any important legal documents or operate any machinery for 24 hours (because of the anesthesia used during the test).  NUTRITION  Drink plenty of fluids.   You may resume your normal diet as instructed by your doctor.   Begin with a light meal and progress to your normal diet. Heavy or fried foods are harder to digest and may make you feel sick to your stomach (nauseated).   Avoid alcoholic beverages for 24 hours or as instructed.  MEDICATIONS  You may resume your normal medications unless your doctor tells you otherwise.  WHAT YOU CAN EXPECT TODAY  Some feelings of bloating in the abdomen.   Passage of more gas than usual.   Spotting of blood in your stool or on the toilet paper.  IF YOU HAD POLYPS REMOVED DURING THE COLONOSCOPY:  No aspirin products for 7 days or as instructed.   No alcohol for 7 days or as instructed.   Eat a soft diet for the next 24 hours.  FINDING OUT THE RESULTS OF YOUR TEST Not all test results are available during your visit. If your test results are not back during the visit, make an appointment  with your caregiver to find out the results. Do not assume everything is normal if you have not heard from your caregiver or the medical facility. It is important for you to follow up on all of your test results.  SEEK IMMEDIATE MEDICAL ATTENTION IF:  You have more than a spotting of blood in your stool.   Your belly is swollen (abdominal distention).   You are nauseated or vomiting.   You have a temperature over 101.   You have abdominal pain or discomfort that is severe or gets worse throughout the day.    Colon polyp and diverticulosis information provided  Further recommendations to follow pending review of pathology report  You had 3 polyps removed from your colon.  I will send you a letter in about a week's time with the results.   Colon Polyps  Polyps are tissue growths inside the body. Polyps can grow in many places, including the large intestine (colon). A polyp may be a round bump or a mushroom-shaped growth. You could have one polyp or several. Most colon polyps are noncancerous (benign). However, some colon polyps can become cancerous over time. Finding and removing the polyps early can help prevent this. What are the causes? The exact cause of colon polyps is not known. What increases the risk? You are more likely to develop this condition if you:  Have a family history of colon cancer or colon polyps.  Are older than 50 or  older than 45 if you are African American.  Have inflammatory bowel disease, such as ulcerative colitis or Crohn's disease.  Have certain hereditary conditions, such as: ? Familial adenomatous polyposis. ? Lynch syndrome. ? Turcot syndrome. ? Peutz-Jeghers syndrome.  Are overweight.  Smoke cigarettes.  Do not get enough exercise.  Drink too much alcohol.  Eat a diet that is high in fat and red meat and low in fiber.  Had childhood cancer that was treated with abdominal radiation. What are the signs or symptoms? Most polyps do not  cause symptoms. If you have symptoms, they may include:  Blood coming from your rectum when having a bowel movement.  Blood in your stool. The stool may look dark red or black.  Abdominal pain.  A change in bowel habits, such as constipation or diarrhea. How is this diagnosed? This condition is diagnosed with a colonoscopy. This is a procedure in which a lighted, flexible scope is inserted into the anus and then passed into the colon to examine the area. Polyps are sometimes found when a colonoscopy is done as part of routine cancer screening tests. How is this treated? Treatment for this condition involves removing any polyps that are found. Most polyps can be removed during a colonoscopy. Those polyps will then be tested for cancer. Additional treatment may be needed depending on the results of testing. Follow these instructions at home: Lifestyle  Maintain a healthy weight, or lose weight if recommended by your health care provider.  Exercise every day or as told by your health care provider.  Do not use any products that contain nicotine or tobacco, such as cigarettes and e-cigarettes. If you need help quitting, ask your health care provider.  If you drink alcohol, limit how much you have: ? 0-1 drink a day for women. ? 0-2 drinks a day for men.  Be aware of how much alcohol is in your drink. In the [place] one drink equals one 12 oz bottle of beer (355 mL), one 5 oz glass of wine (148 mL), or one 1 oz shot of hard liquor (44 mL). Eating and drinking   Eat foods that are high in fiber, such as fruits, vegetables, and whole grains.  Eat foods that are high in calcium and vitamin D, such as milk, cheese, yogurt, eggs, liver, fish, and broccoli.  Limit foods that are high in fat, such as fried foods and desserts.  Limit the amount of red meat and processed meat you eat, such as hot dogs, sausage, bacon, and lunch meats. General instructions  Keep all follow-up visits as told  by your health care provider. This is important. ? This includes having regularly scheduled colonoscopies. ? Talk to your health care provider about when you need a colonoscopy. Contact a health care provider if:  You have new or worsening bleeding during a bowel movement.  You have new or increased blood in your stool.  You have a change in bowel habits.  You lose weight for no known reason. Summary  Polyps are tissue growths inside the body. Polyps can grow in many places, including the colon.  Most colon polyps are noncancerous (benign), but some can become cancerous over time.  This condition is diagnosed with a colonoscopy.  Treatment for this condition involves removing any polyps that are found. Most polyps can be removed during a colonoscopy. This information is not intended to replace advice given to you by your health care provider. Make sure you discuss any questions you  have with your health care provider. Document Released: 05/16/2004 Document Revised: 12/05/2017 Document Reviewed: 12/05/2017 Elsevier Patient Education  2020 [employer].  Diverticulosis  Diverticulosis is a condition that develops when small pouches (diverticula) form in the wall of the large intestine (colon). The colon is where water is absorbed and stool is formed. The pouches form when the inside layer of the colon pushes through weak spots in the outer layers of the colon. You may have a few pouches or many of them. What are the causes? The cause of this condition is not known. What increases the risk? The following factors may make you more likely to develop this condition:  Being older than age 20. Your risk for this condition increases with age. Diverticulosis is rare among people younger than age 63. By age 59, many people have it.  Eating a low-fiber diet.  Having frequent constipation.  Being overweight.  Not getting enough exercise.  Smoking.  Taking over-the-counter pain  medicines, like aspirin and ibuprofen.  Having a family history of diverticulosis. What are the signs or symptoms? In most people, there are no symptoms of this condition. If you do have symptoms, they may include:  Bloating.  Cramps in the abdomen.  Constipation or diarrhea.  Pain in the lower left side of the abdomen. How is this diagnosed? This condition is most often diagnosed during an exam for other colon problems. Because diverticulosis usually has no symptoms, it often cannot be diagnosed independently. This condition may be diagnosed by:  Using a flexible scope to examine the colon (colonoscopy).  Taking an X-ray of the colon after dye has been put into the colon (barium enema).  Doing a CT scan. How is this treated? You may not need treatment for this condition if you have never developed an infection related to diverticulosis. If you have had an infection before, treatment may include:  Eating a high-fiber diet. This may include eating more fruits, vegetables, and grains.  Taking a fiber supplement.  Taking a live bacteria supplement (probiotic).  Taking medicine to relax your colon.  Taking antibiotic medicines. Follow these instructions at home:  Drink 6-8 glasses of water or more each day to prevent constipation.  Try not to strain when you have a bowel movement.  If you have had an infection before: ? Eat more fiber as directed by your health care provider or your diet and nutrition specialist (dietitian). ? Take a fiber supplement or probiotic, if your health care provider approves.  Take over-the-counter and prescription medicines only as told by your health care provider.  If you were prescribed an antibiotic, take it as told by your health care provider. Do not stop taking the antibiotic even if you start to feel better.  Keep all follow-up visits as told by your health care provider. This is important. Contact a health care provider if:  You have  pain in your abdomen.  You have bloating.  You have cramps.  You have not had a bowel movement in 3 days. Get help right away if:  Your pain gets worse.  Your bloating becomes very bad.  You have a fever or chills, and your symptoms suddenly get worse.  You vomit.  You have bowel movements that are bloody or black.  You have bleeding from your rectum. Summary  Diverticulosis is a condition that develops when small pouches (diverticula) form in the wall of the large intestine (colon).  You may have a few pouches or many of  them.  This condition is most often diagnosed during an exam for other colon problems.  If you have had an infection related to diverticulosis, treatment may include increasing the fiber in your diet, taking supplements, or taking medicines. This information is not intended to replace advice given to you by your health care provider. Make sure you discuss any questions you have with your health care provider. Document Released: 05/17/2004 Document Revised: 08/02/2017 Document Reviewed: 07/09/2016 Elsevier Patient Education  2020 [employer].

## 2019-05-19 NOTE — Op Note (Signed)
[facility] Patient Name: Gwendolyn Kemp Procedure Date: 05/19/2019 8:13 AM MRN: [id number] Date of Birth: 07/04/1946 Attending MD: Norvel Richards , MD CSN: [id number] Age: 73 Admit Type: Outpatient Procedure:                Colonoscopy Indications:              Screening for colorectal malignant neoplasm Providers:                Norvel Richards, MD, Janeece Riggers, RN, Nelma Rothman, Technician Referring MD:              Medicines:                Midazolam 4 mg IV, Meperidine 25 mg IV, Ondansetron                            4 mg IV Complications:            No immediate complications. Estimated Blood Loss:     Estimated blood loss was minimal. Procedure:                Pre-Anesthesia Assessment:                           - Prior to the procedure, a History and Physical                            was performed, and patient medications and                            allergies were reviewed. The patient's tolerance of                            previous anesthesia was also reviewed. The risks                            and benefits of the procedure and the sedation                            options and risks were discussed with the patient.                            All questions were answered, and informed consent                            was obtained. Prior Anticoagulants: The patient has                            taken no previous anticoagulant or antiplatelet                            agents. ASA Grade Assessment: II - A patient with  mild systemic disease. After reviewing the risks                            and benefits, the patient was deemed in                            satisfactory condition to undergo the procedure.                           After obtaining informed consent, the colonoscope                            was passed under direct vision. Throughout the                            procedure, the  patient's blood pressure, pulse, and                            oxygen saturations were monitored continuously. The                            CF-HQ190L [id number]) scope was introduced through                            the anus and advanced to the the cecum, identified                            by appendiceal orifice and ileocecal valve. The                            colonoscopy was performed without difficulty. The                            patient tolerated the procedure well. The quality                            of the bowel preparation was adequate. Scope In: 8:28:11 AM Scope Out: 8:48:16 AM Scope Withdrawal Time: 0 hours 12 minutes 22 seconds  Total Procedure Duration: 0 hours 20 minutes 5 seconds  Findings:      The perianal and digital rectal examinations were normal.      Scattered medium-mouthed diverticula were found in the sigmoid colon and       descending colon.      Three sessile polyps were found in the sigmoid colon and splenic       flexure. The polyps were 5 to 6 mm in size. These polyps were removed       with a cold snare. Resection and retrieval were complete. Estimated       blood loss was minimal.      The exam was otherwise without abnormality on direct and retroflexion       views. Impression:               - Diverticulosis in the sigmoid colon and in the  descending colon.                           - Three 5 to 6 mm polyps in the sigmoid colon and                            at the splenic flexure, removed with a cold snare.                            Resected and retrieved.                           - The examination was otherwise normal on direct                            and retroflexion views. Moderate Sedation:      Moderate (conscious) sedation was administered by the endoscopy nurse       and supervised by the endoscopist. The following parameters were       monitored: oxygen saturation, heart rate, blood pressure,  respiratory       rate, EKG, adequacy of pulmonary ventilation, and response to care. Recommendation:           - Patient has a contact number available for                            emergencies. The signs and symptoms of potential                            delayed complications were discussed with the                            patient. Return to normal activities tomorrow.                            Written discharge instructions were provided to the                            patient.                           - Resume previous diet.                           - Repeat colonoscopy date to be determined after                            pending pathology results are reviewed for                            surveillance based on pathology results.                           - Return to GI office (date not yet determined). Procedure Code(s):        --- Professional ---  45385, Colonoscopy, flexible; with removal of                            tumor(s), polyp(s), or other lesion(s) by snare                            technique Diagnosis Code(s):        --- Professional ---                           Z12.11, Encounter for screening for malignant                            neoplasm of colon                           K63.5, Polyp of colon                           K57.30, Diverticulosis of large intestine without                            perforation or abscess without bleeding CPT copyright 2019 [employer]. All rights reserved. The codes documented in this report are preliminary and upon coder review may  be revised to meet current compliance requirements. Cristopher Estimable. Philippa Vessey, MD Norvel Richards, MD 05/19/2019 8:55:19 AM This report has been signed electronically. Number of Addenda: 0

## 2019-05-20 ENCOUNTER — Encounter: Payer: Self-pay | Admitting: Internal Medicine

## 2019-05-20 LAB — SURGICAL PATHOLOGY

## 2019-05-21 ENCOUNTER — Encounter (HOSPITAL_COMMUNITY): Payer: Self-pay | Admitting: Internal Medicine

## 2019-10-04 DIAGNOSIS — I10 Essential (primary) hypertension: Secondary | ICD-10-CM | POA: Diagnosis not present

## 2019-10-04 DIAGNOSIS — E7849 Other hyperlipidemia: Secondary | ICD-10-CM | POA: Diagnosis not present

## 2019-12-02 DIAGNOSIS — E7849 Other hyperlipidemia: Secondary | ICD-10-CM | POA: Diagnosis not present

## 2019-12-02 DIAGNOSIS — I10 Essential (primary) hypertension: Secondary | ICD-10-CM | POA: Diagnosis not present

## 2020-02-01 DIAGNOSIS — I10 Essential (primary) hypertension: Secondary | ICD-10-CM | POA: Diagnosis not present

## 2020-02-01 DIAGNOSIS — E7849 Other hyperlipidemia: Secondary | ICD-10-CM | POA: Diagnosis not present

## 2020-02-25 DIAGNOSIS — Z1389 Encounter for screening for other disorder: Secondary | ICD-10-CM | POA: Diagnosis not present

## 2020-02-25 DIAGNOSIS — E7849 Other hyperlipidemia: Secondary | ICD-10-CM | POA: Diagnosis not present

## 2020-02-25 DIAGNOSIS — E6609 Other obesity due to excess calories: Secondary | ICD-10-CM | POA: Diagnosis not present

## 2020-02-25 DIAGNOSIS — I10 Essential (primary) hypertension: Secondary | ICD-10-CM | POA: Diagnosis not present

## 2020-02-25 DIAGNOSIS — Z0001 Encounter for general adult medical examination with abnormal findings: Secondary | ICD-10-CM | POA: Diagnosis not present

## 2020-02-25 DIAGNOSIS — Z6831 Body mass index (BMI) 31.0-31.9, adult: Secondary | ICD-10-CM | POA: Diagnosis not present

## 2020-04-01 DIAGNOSIS — E7849 Other hyperlipidemia: Secondary | ICD-10-CM | POA: Diagnosis not present

## 2020-04-01 DIAGNOSIS — I10 Essential (primary) hypertension: Secondary | ICD-10-CM | POA: Diagnosis not present

## 2020-06-02 DIAGNOSIS — E7849 Other hyperlipidemia: Secondary | ICD-10-CM | POA: Diagnosis not present

## 2020-06-02 DIAGNOSIS — I10 Essential (primary) hypertension: Secondary | ICD-10-CM | POA: Diagnosis not present

## 2020-07-02 DIAGNOSIS — E7849 Other hyperlipidemia: Secondary | ICD-10-CM | POA: Diagnosis not present

## 2020-07-02 DIAGNOSIS — I10 Essential (primary) hypertension: Secondary | ICD-10-CM | POA: Diagnosis not present

## 2020-08-02 DIAGNOSIS — I10 Essential (primary) hypertension: Secondary | ICD-10-CM | POA: Diagnosis not present

## 2020-08-02 DIAGNOSIS — E7849 Other hyperlipidemia: Secondary | ICD-10-CM | POA: Diagnosis not present

## 2020-09-01 DIAGNOSIS — Z6831 Body mass index (BMI) 31.0-31.9, adult: Secondary | ICD-10-CM | POA: Diagnosis not present

## 2020-09-01 DIAGNOSIS — I10 Essential (primary) hypertension: Secondary | ICD-10-CM | POA: Diagnosis not present

## 2020-09-01 DIAGNOSIS — E6609 Other obesity due to excess calories: Secondary | ICD-10-CM | POA: Diagnosis not present

## 2020-09-09 DIAGNOSIS — I10 Essential (primary) hypertension: Secondary | ICD-10-CM | POA: Diagnosis not present

## 2021-03-01 ENCOUNTER — Other Ambulatory Visit: Payer: Self-pay | Admitting: Family Medicine

## 2021-03-01 DIAGNOSIS — Z139 Encounter for screening, unspecified: Secondary | ICD-10-CM

## 2021-03-09 DIAGNOSIS — I10 Essential (primary) hypertension: Secondary | ICD-10-CM | POA: Diagnosis not present

## 2021-03-09 DIAGNOSIS — Z1331 Encounter for screening for depression: Secondary | ICD-10-CM | POA: Diagnosis not present

## 2021-03-09 DIAGNOSIS — Z6830 Body mass index (BMI) 30.0-30.9, adult: Secondary | ICD-10-CM | POA: Diagnosis not present

## 2021-03-09 DIAGNOSIS — E6609 Other obesity due to excess calories: Secondary | ICD-10-CM | POA: Diagnosis not present

## 2021-03-09 DIAGNOSIS — Z0001 Encounter for general adult medical examination with abnormal findings: Secondary | ICD-10-CM | POA: Diagnosis not present

## 2021-03-10 DIAGNOSIS — Z6830 Body mass index (BMI) 30.0-30.9, adult: Secondary | ICD-10-CM | POA: Diagnosis not present

## 2021-03-10 DIAGNOSIS — E6609 Other obesity due to excess calories: Secondary | ICD-10-CM | POA: Diagnosis not present

## 2021-03-10 DIAGNOSIS — Z1389 Encounter for screening for other disorder: Secondary | ICD-10-CM | POA: Diagnosis not present

## 2021-03-10 DIAGNOSIS — E782 Mixed hyperlipidemia: Secondary | ICD-10-CM | POA: Diagnosis not present

## 2021-03-14 ENCOUNTER — Ambulatory Visit
Admission: RE | Admit: 2021-03-14 | Discharge: 2021-03-14 | Disposition: A | Discharge status: Home / Self Care | Payer: PPO | Source: Ambulatory Visit | Attending: Family Medicine | Admitting: Family Medicine

## 2021-03-14 ENCOUNTER — Other Ambulatory Visit: Payer: Self-pay

## 2021-03-14 DIAGNOSIS — Z1231 Encounter for screening mammogram for malignant neoplasm of breast: Secondary | ICD-10-CM | POA: Diagnosis not present

## 2021-03-14 DIAGNOSIS — Z139 Encounter for screening, unspecified: Secondary | ICD-10-CM

## 2021-03-30 DIAGNOSIS — I10 Essential (primary) hypertension: Secondary | ICD-10-CM | POA: Diagnosis not present

## 2021-03-30 DIAGNOSIS — E6609 Other obesity due to excess calories: Secondary | ICD-10-CM | POA: Diagnosis not present

## 2021-03-30 DIAGNOSIS — Z6830 Body mass index (BMI) 30.0-30.9, adult: Secondary | ICD-10-CM | POA: Diagnosis not present

## 2021-03-30 DIAGNOSIS — M62831 Muscle spasm of calf: Secondary | ICD-10-CM | POA: Diagnosis not present

## 2021-06-02 DIAGNOSIS — E6609 Other obesity due to excess calories: Secondary | ICD-10-CM | POA: Diagnosis not present

## 2021-06-02 DIAGNOSIS — E782 Mixed hyperlipidemia: Secondary | ICD-10-CM | POA: Diagnosis not present

## 2021-06-02 DIAGNOSIS — I10 Essential (primary) hypertension: Secondary | ICD-10-CM | POA: Diagnosis not present

## 2021-08-24 DIAGNOSIS — Z6830 Body mass index (BMI) 30.0-30.9, adult: Secondary | ICD-10-CM | POA: Diagnosis not present

## 2021-08-24 DIAGNOSIS — E6609 Other obesity due to excess calories: Secondary | ICD-10-CM | POA: Diagnosis not present

## 2021-08-24 DIAGNOSIS — I10 Essential (primary) hypertension: Secondary | ICD-10-CM | POA: Diagnosis not present

## 2021-08-24 DIAGNOSIS — E782 Mixed hyperlipidemia: Secondary | ICD-10-CM | POA: Diagnosis not present

## 2021-12-17 IMAGING — MG MM DIGITAL SCREENING BILAT W/ TOMO AND CAD
8 series · 8 of 24 positions shown · non-contrast
Comparison: Previous exam(s).

CLINICAL DATA: Screening.

EXAM:
DIGITAL SCREENING BILATERAL MAMMOGRAM WITH TOMOSYNTHESIS AND CAD
TECHNIQUE: Bilateral screening digital craniocaudal and mediolateral oblique
mammograms were obtained. Bilateral screening digital breast
tomosynthesis was performed. The images were evaluated with
computer-aided detection.

[R MLO synth-2D]
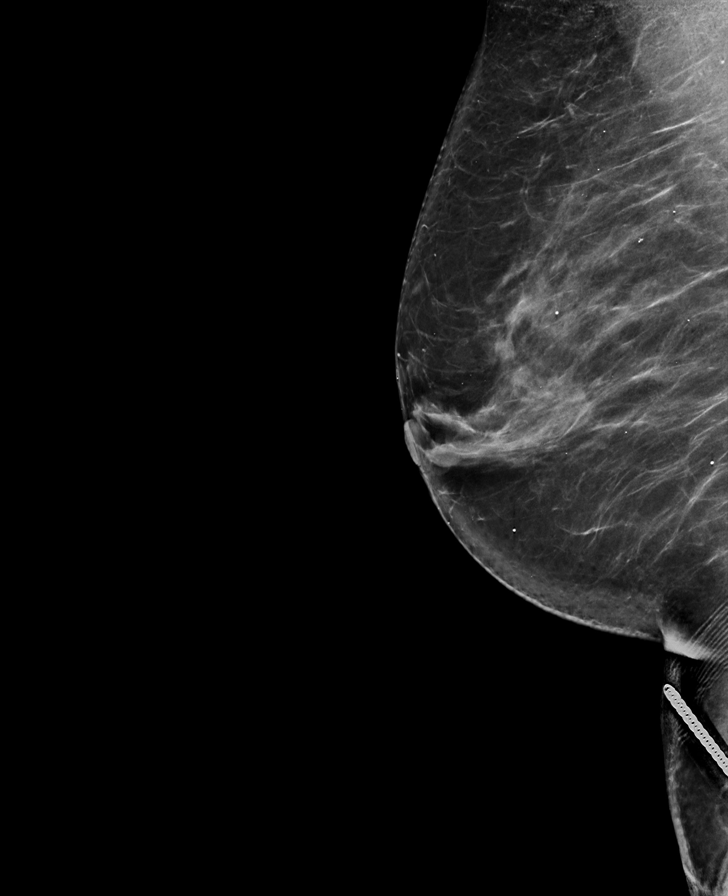

[L MLO synth-2D]
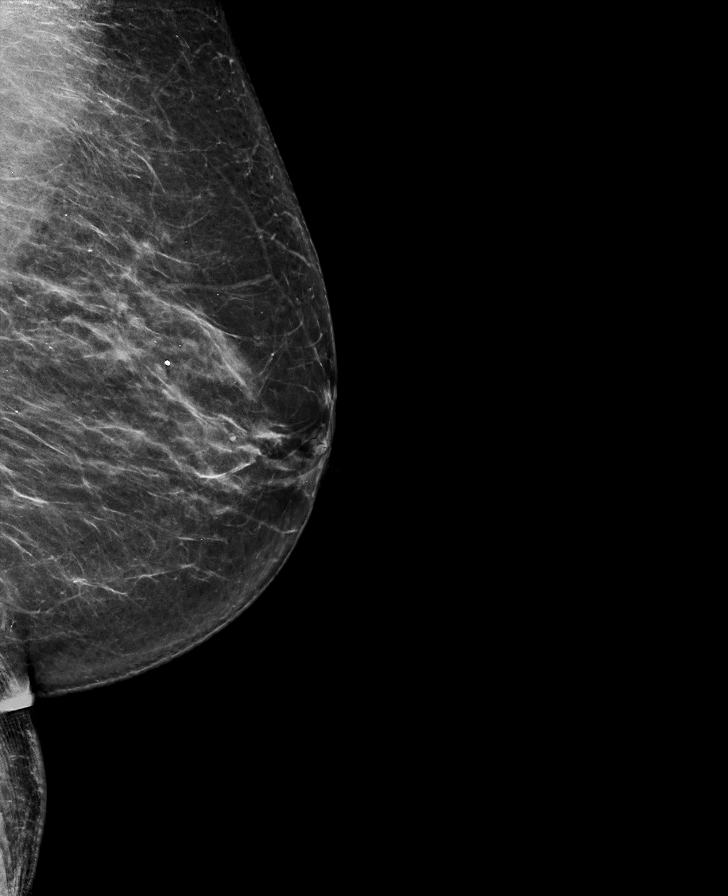

[R CC synth-2D]
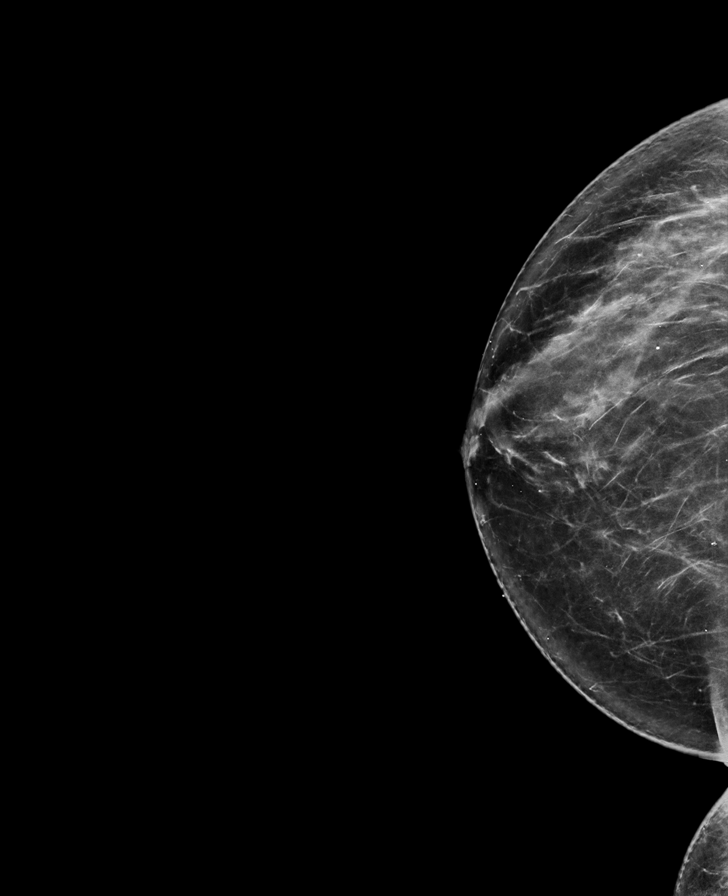

[L CC synth-2D]
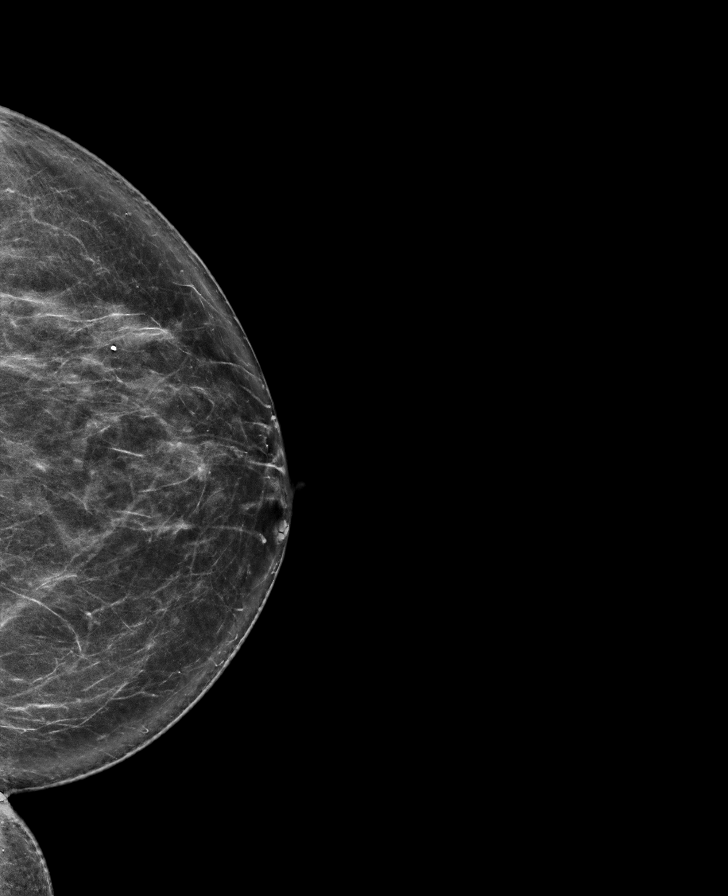

[L MLO tomo · tomo slice 42/83.0]
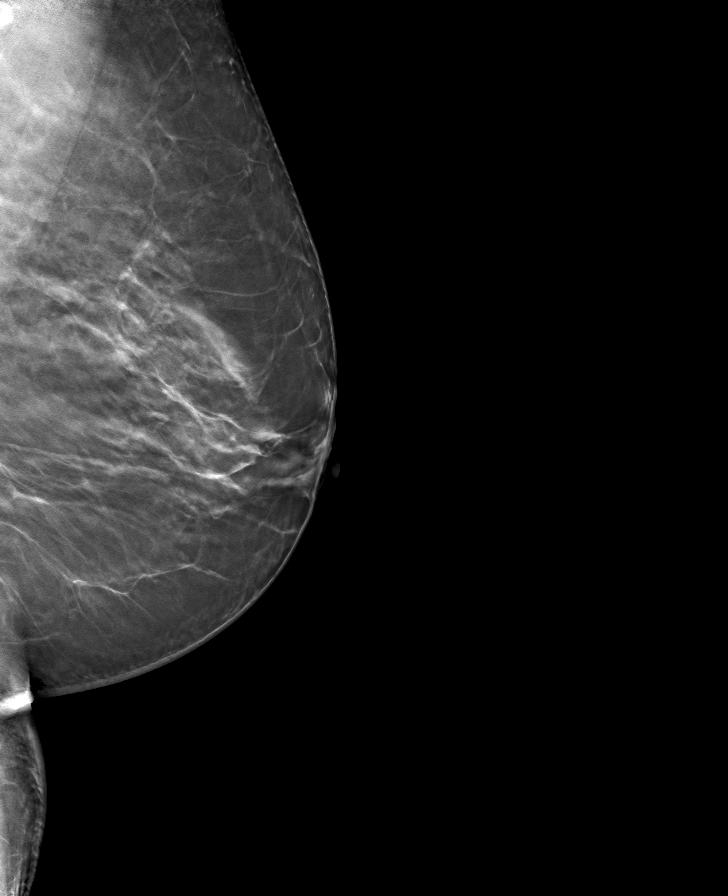

[R MLO tomo · tomo slice 45/89.0]
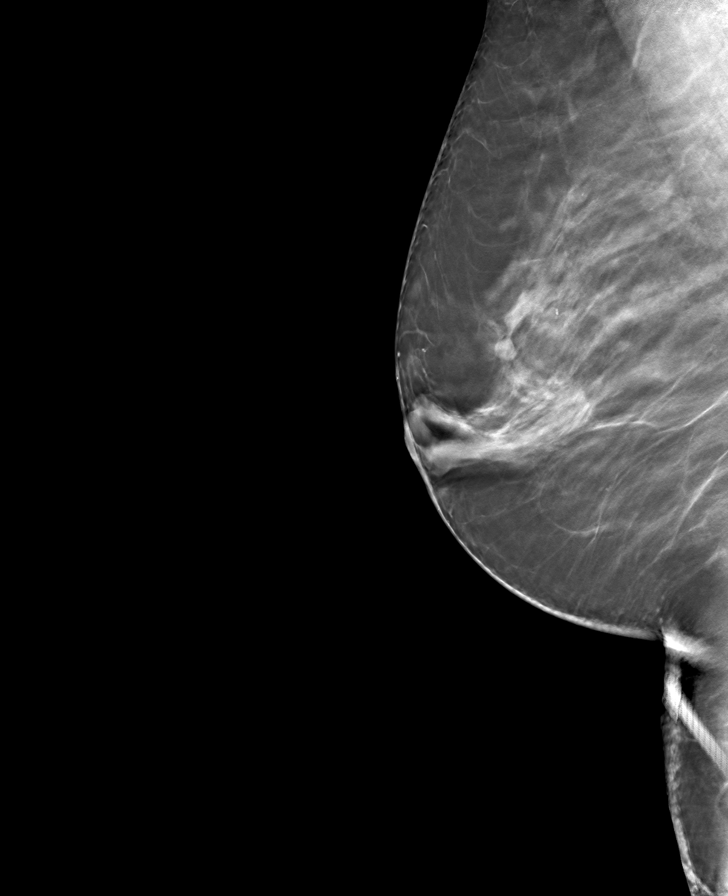

[R CC tomo · tomo slice 42/83.0]
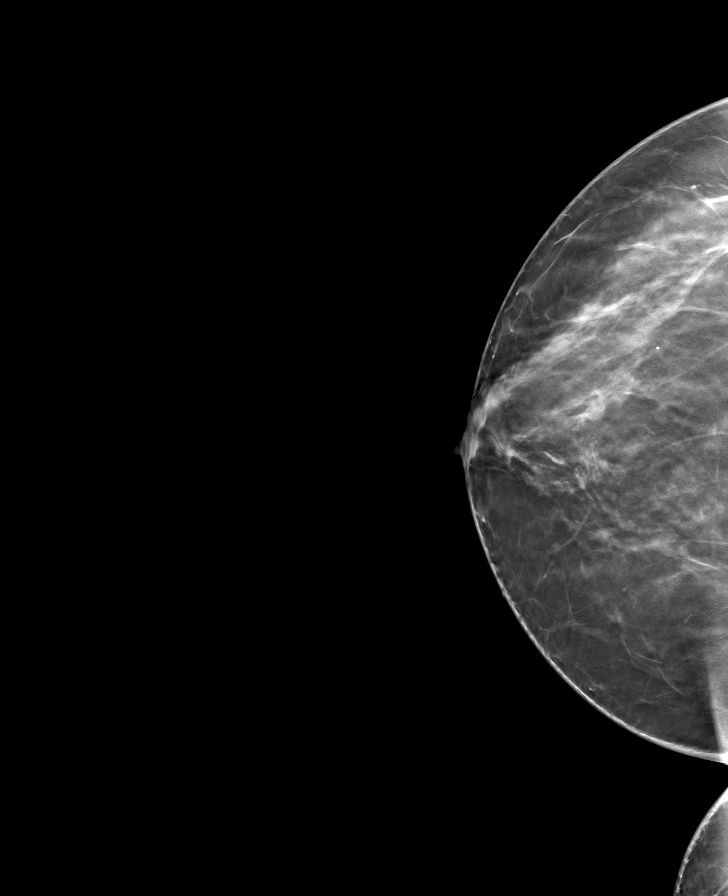

[L CC tomo · tomo slice 41/82.0]
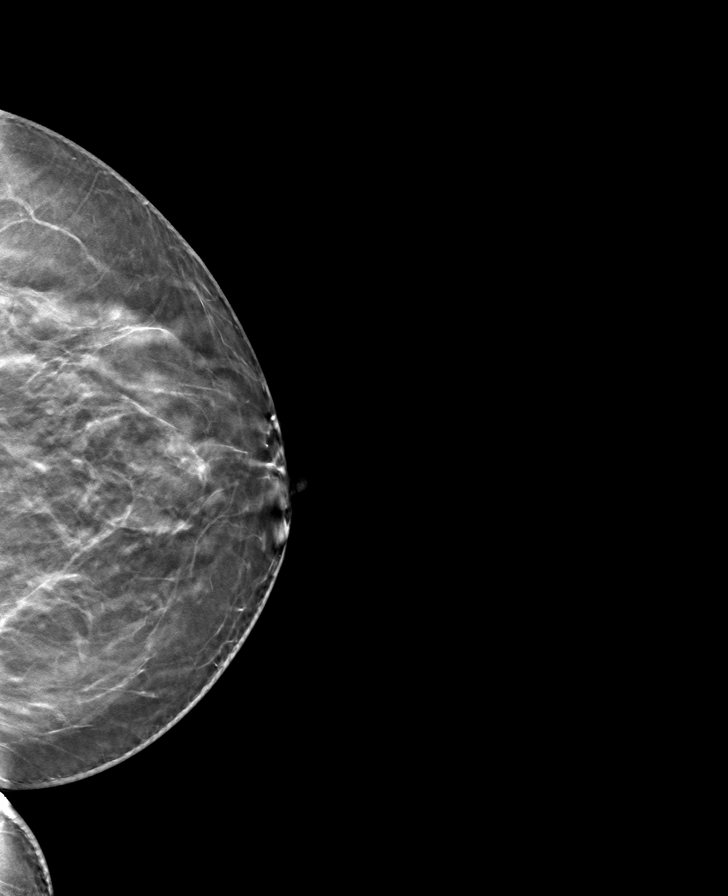

[8 of 24 positions shown; findings below may reference images not displayed]

ACR Breast Density Category c: The breast tissue is heterogeneously
dense, which may obscure small masses.
FINDINGS: There are no findings suspicious for malignancy.
IMPRESSION: No mammographic evidence of malignancy. A result letter of this
screening mammogram will be mailed directly to the patient.

RECOMMENDATION:
Screening mammogram in one year. (Code:Q3-W-BC3)

BI-RADS CATEGORY  1: Negative.

## 2022-03-29 ENCOUNTER — Other Ambulatory Visit: Payer: Self-pay | Admitting: Internal Medicine

## 2022-03-29 ENCOUNTER — Other Ambulatory Visit: Payer: Self-pay | Admitting: Family Medicine

## 2022-03-29 DIAGNOSIS — Z1231 Encounter for screening mammogram for malignant neoplasm of breast: Secondary | ICD-10-CM

## 2022-05-11 DIAGNOSIS — Z6830 Body mass index (BMI) 30.0-30.9, adult: Secondary | ICD-10-CM | POA: Diagnosis not present

## 2022-05-11 DIAGNOSIS — Z0001 Encounter for general adult medical examination with abnormal findings: Secondary | ICD-10-CM | POA: Diagnosis not present

## 2022-05-11 DIAGNOSIS — I10 Essential (primary) hypertension: Secondary | ICD-10-CM | POA: Diagnosis not present

## 2022-05-11 DIAGNOSIS — Z1331 Encounter for screening for depression: Secondary | ICD-10-CM | POA: Diagnosis not present

## 2022-05-11 DIAGNOSIS — E782 Mixed hyperlipidemia: Secondary | ICD-10-CM | POA: Diagnosis not present

## 2022-05-11 DIAGNOSIS — E6609 Other obesity due to excess calories: Secondary | ICD-10-CM | POA: Diagnosis not present

## 2022-05-11 DIAGNOSIS — Z23 Encounter for immunization: Secondary | ICD-10-CM | POA: Diagnosis not present

## 2022-05-17 DIAGNOSIS — Z0001 Encounter for general adult medical examination with abnormal findings: Secondary | ICD-10-CM | POA: Diagnosis not present

## 2022-07-20 ENCOUNTER — Ambulatory Visit
Admission: RE | Admit: 2022-07-20 | Discharge: 2022-07-20 | Disposition: A | Discharge status: Home / Self Care | Payer: PPO | Source: Ambulatory Visit | Attending: Family Medicine | Admitting: Family Medicine

## 2022-07-20 DIAGNOSIS — Z1231 Encounter for screening mammogram for malignant neoplasm of breast: Secondary | ICD-10-CM | POA: Diagnosis not present

## 2022-08-23 DIAGNOSIS — I10 Essential (primary) hypertension: Secondary | ICD-10-CM | POA: Diagnosis not present

## 2022-08-23 DIAGNOSIS — Z6830 Body mass index (BMI) 30.0-30.9, adult: Secondary | ICD-10-CM | POA: Diagnosis not present

## 2022-08-23 DIAGNOSIS — E6609 Other obesity due to excess calories: Secondary | ICD-10-CM | POA: Diagnosis not present

## 2022-08-23 DIAGNOSIS — E782 Mixed hyperlipidemia: Secondary | ICD-10-CM | POA: Diagnosis not present

## 2023-07-18 ENCOUNTER — Other Ambulatory Visit: Payer: Self-pay | Admitting: Family Medicine

## 2023-07-18 DIAGNOSIS — Z1231 Encounter for screening mammogram for malignant neoplasm of breast: Secondary | ICD-10-CM

## 2023-07-22 ENCOUNTER — Ambulatory Visit
Admission: RE | Admit: 2023-07-22 | Discharge: 2023-07-22 | Disposition: A | Discharge status: Home / Self Care | Payer: PPO | Source: Ambulatory Visit | Attending: Family Medicine | Admitting: Family Medicine

## 2023-07-22 DIAGNOSIS — Z1231 Encounter for screening mammogram for malignant neoplasm of breast: Secondary | ICD-10-CM

## 2023-08-30 DIAGNOSIS — Z6829 Body mass index (BMI) 29.0-29.9, adult: Secondary | ICD-10-CM | POA: Diagnosis not present

## 2023-08-30 DIAGNOSIS — E782 Mixed hyperlipidemia: Secondary | ICD-10-CM | POA: Diagnosis not present

## 2023-08-30 DIAGNOSIS — I10 Essential (primary) hypertension: Secondary | ICD-10-CM | POA: Diagnosis not present

## 2023-08-30 DIAGNOSIS — E663 Overweight: Secondary | ICD-10-CM | POA: Diagnosis not present

## 2023-09-02 DIAGNOSIS — I10 Essential (primary) hypertension: Secondary | ICD-10-CM | POA: Diagnosis not present

## 2023-09-02 DIAGNOSIS — E7849 Other hyperlipidemia: Secondary | ICD-10-CM | POA: Diagnosis not present

## 2023-09-20 DIAGNOSIS — Z0001 Encounter for general adult medical examination with abnormal findings: Secondary | ICD-10-CM | POA: Diagnosis not present

## 2023-09-20 DIAGNOSIS — E782 Mixed hyperlipidemia: Secondary | ICD-10-CM | POA: Diagnosis not present

## 2023-09-20 DIAGNOSIS — Z1331 Encounter for screening for depression: Secondary | ICD-10-CM | POA: Diagnosis not present

## 2023-09-20 DIAGNOSIS — I10 Essential (primary) hypertension: Secondary | ICD-10-CM | POA: Diagnosis not present

## 2023-09-20 DIAGNOSIS — Z6829 Body mass index (BMI) 29.0-29.9, adult: Secondary | ICD-10-CM | POA: Diagnosis not present

## 2023-09-20 DIAGNOSIS — E663 Overweight: Secondary | ICD-10-CM | POA: Diagnosis not present

## 2024-05-15 DIAGNOSIS — E782 Mixed hyperlipidemia: Secondary | ICD-10-CM | POA: Diagnosis not present

## 2024-05-15 DIAGNOSIS — I10 Essential (primary) hypertension: Secondary | ICD-10-CM | POA: Diagnosis not present

## 2024-05-15 DIAGNOSIS — E663 Overweight: Secondary | ICD-10-CM | POA: Diagnosis not present

## 2024-05-15 DIAGNOSIS — Z6828 Body mass index (BMI) 28.0-28.9, adult: Secondary | ICD-10-CM | POA: Diagnosis not present

## 2024-06-24 ENCOUNTER — Other Ambulatory Visit: Payer: Self-pay | Admitting: Family Medicine

## 2024-06-24 DIAGNOSIS — Z1231 Encounter for screening mammogram for malignant neoplasm of breast: Secondary | ICD-10-CM

## 2024-07-22 ENCOUNTER — Ambulatory Visit

## 2024-09-01 ENCOUNTER — Telehealth: Payer: Self-pay | Admitting: Family Medicine

## 2024-09-01 NOTE — Telephone Encounter (Signed)
 Copied from CRM [id number]. Topic: Appointments - Appointment Scheduling >> Sep 01, 2024 10:09 AM Franky GRADE wrote: Patient/patient representative is calling to schedule an appointment. Refer to attachments for appointment information. Patient had a new patient appointment with Edman Das, however, it was canceled due to pregnancy leave. Patient does not mind waiting but would only like to see her.

## 2024-09-14 ENCOUNTER — Ambulatory Visit: Admitting: Family Medicine

## 2024-09-14 ENCOUNTER — Ambulatory Visit: Payer: Self-pay | Admitting: Family Medicine

## 2024-09-16 ENCOUNTER — Ambulatory Visit: Payer: Self-pay | Admitting: Family Medicine

## 2024-10-09 ENCOUNTER — Encounter (INDEPENDENT_AMBULATORY_CARE_PROVIDER_SITE_OTHER): Payer: Self-pay | Admitting: *Deleted

## 2025-01-05 ENCOUNTER — Ambulatory Visit: Payer: Self-pay
